# Patient Record
Sex: Female | Born: 1990 | Race: White | Hispanic: No | Marital: Single | Smoking: Never smoker
Health system: Southern US, Community
[De-identification: ages and names within clinical notes are randomized; demographics above are authoritative.]

---

## 2011-01-24 ENCOUNTER — Emergency Department (INDEPENDENT_AMBULATORY_CARE_PROVIDER_SITE_OTHER): Payer: BC Managed Care – PPO

## 2011-01-24 ENCOUNTER — Emergency Department (HOSPITAL_BASED_OUTPATIENT_CLINIC_OR_DEPARTMENT_OTHER)
Admission: EM | Admit: 2011-01-24 | Discharge: 2011-01-24 | Disposition: A | Discharge status: Home / Self Care | Payer: BC Managed Care – PPO | Attending: Emergency Medicine | Admitting: Emergency Medicine

## 2011-01-24 DIAGNOSIS — S93409A Sprain of unspecified ligament of unspecified ankle, initial encounter: Secondary | ICD-10-CM | POA: Insufficient documentation

## 2011-01-24 DIAGNOSIS — W19XXXA Unspecified fall, initial encounter: Secondary | ICD-10-CM

## 2011-01-24 DIAGNOSIS — M25579 Pain in unspecified ankle and joints of unspecified foot: Secondary | ICD-10-CM

## 2011-01-24 DIAGNOSIS — X500XXA Overexertion from strenuous movement or load, initial encounter: Secondary | ICD-10-CM | POA: Insufficient documentation

## 2016-03-09 ENCOUNTER — Encounter (HOSPITAL_BASED_OUTPATIENT_CLINIC_OR_DEPARTMENT_OTHER): Payer: Self-pay | Admitting: *Deleted

## 2016-03-09 ENCOUNTER — Emergency Department (HOSPITAL_BASED_OUTPATIENT_CLINIC_OR_DEPARTMENT_OTHER): Payer: 59

## 2016-03-09 ENCOUNTER — Emergency Department (HOSPITAL_BASED_OUTPATIENT_CLINIC_OR_DEPARTMENT_OTHER)
Admission: EM | Admit: 2016-03-09 | Discharge: 2016-03-09 | Disposition: A | Discharge status: Home / Self Care | Payer: 59 | Attending: Emergency Medicine | Admitting: Emergency Medicine

## 2016-03-09 DIAGNOSIS — X509XXA Other and unspecified overexertion or strenuous movements or postures, initial encounter: Secondary | ICD-10-CM | POA: Insufficient documentation

## 2016-03-09 DIAGNOSIS — Y929 Unspecified place or not applicable: Secondary | ICD-10-CM | POA: Diagnosis not present

## 2016-03-09 DIAGNOSIS — Y999 Unspecified external cause status: Secondary | ICD-10-CM | POA: Diagnosis not present

## 2016-03-09 DIAGNOSIS — Y9364 Activity, baseball: Secondary | ICD-10-CM | POA: Insufficient documentation

## 2016-03-09 DIAGNOSIS — S93401A Sprain of unspecified ligament of right ankle, initial encounter: Secondary | ICD-10-CM | POA: Diagnosis present

## 2016-03-09 NOTE — ED Triage Notes (Signed)
Pt c/o right ankle injury x 1 hr ago  

## 2016-03-09 NOTE — ED Provider Notes (Signed)
[facility]   CSN: [id number] Arrival date & time: 03/09/16  2123  First Provider Contact:   First MD Initiated Contact with Patient 03/09/16 2241     By signing my name below, I, Freida Busman, attest that this documentation has been prepared under the direction and in the presence of Zadie Rhine, MD . Electronically Signed: Freida Busman, Scribe. 03/09/2016. 10:50 PM.   History   Chief Complaint Chief Complaint  Patient presents with  . Ankle Injury    The history is provided by the patient. No language interpreter was used.  Ankle Injury  This is a new problem. The current episode started 3 to 5 hours ago. The problem occurs constantly. The problem has not changed since onset.Pertinent negatives include no chest pain, no abdominal pain, no headaches and no shortness of breath. The symptoms are aggravated by walking. The symptoms are relieved by NSAIDs.     HPI Comments:  Diana Hunt is a 25 y.o. female who presents to the Emergency Department complaining of constant, moderate, pain to the right ankle s/p injury this evening ~ 1930. Pt was playing softball when she jumped up and rolled the ankle outward as she landed on a base. Her pain is exacerbated when she bears weight on the extremity. She has taken  ibuprofen with mild relief.  She denies knee pain. Pt has no other complaints, symptoms or injuries at this time.   History reviewed. No pertinent past medical history.  There are no active problems to display for this patient.   History reviewed. No pertinent surgical history.  OB History    No data available       Home Medications    Prior to Admission medications   Not on File    Family History No family history on file.  Social History Social History  Substance Use Topics  . Smoking status: Never Smoker  . Smokeless tobacco: Not on file  . Alcohol use No     Allergies   Review of patient's allergies indicates no  known allergies.   Review of Systems Review of Systems  Respiratory: Negative for shortness of breath.   Cardiovascular: Negative for chest pain.  Gastrointestinal: Negative for abdominal pain.  Musculoskeletal: Positive for arthralgias and joint swelling.  Neurological: Negative for syncope and headaches.   Physical Exam Updated Vital Signs BP 125/81 (BP Location: Left Arm)   Pulse 88   Temp 98.2 F (36.8 C) (Oral)   Resp 18   Ht  (1.727 m)   Wt 170 lb (77.1 kg)   LMP 02/21/2016   SpO2 100%   BMI 25.85 kg/m   Physical Exam  CONSTITUTIONAL: Well developed/well nourished HEAD: Normocephalic/atraumatic ENMT: Mucous membranes moist NECK: supple no meningeal signs CV: S1/S2 noted, no murmurs/rubs/gallops noted LUNGS: Lungs are clear to auscultation bilaterally, no apparent distress ABDOMEN: soft, nontender NEURO: Pt is awake/alert/appropriate, moves all extremitiesx4.  No facial droop.   EXTREMITIES: pulses normal/equal, full ROM; tenderness and swelling to right lateral malleolus, no distal foot tenderness, no proximal fibula tenderness SKIN: warm, color normal PSYCH: no abnormalities of mood noted, alert and oriented to situation ED Treatments / Results  DIAGNOSTIC STUDIES:  Oxygen Saturation is 100% on RA, normal by my interpretation.    COORDINATION OF CARE:  10:48 PM Discussed treatment plan with pt at bedside and pt agreed to plan.  Labs (all labs ordered are listed, but only abnormal results are displayed) Labs Reviewed - No data to  display  EKG  EKG Interpretation None       Radiology Dg Ankle Complete Right  Result Date: 03/09/2016 CLINICAL DATA:  25 year old female with right ankle injury EXAM: RIGHT ANKLE - COMPLETE 3+ VIEW COMPARISON:  None. FINDINGS: There is no evidence of fracture, dislocation, or joint effusion. There is no evidence of arthropathy or other focal bone abnormality. Soft tissues are unremarkable. IMPRESSION: Negative.  Electronically Signed   By: Elgie Collard M.D.   On: 03/09/2016 22:05    Procedures Procedures   Medications Ordered in ED Medications - No data to display   Initial Impression / Assessment and Plan / ED Course  I have reviewed the triage vital signs and the nursing notes.  Pertinent labs & imaging results that were available during my care of the patient were reviewed by me and considered in my medical decision making (see chart for details).  Clinical Course    Crutches ASO F/u with sports med Pt agreeable  Final Clinical Impressions(s) / ED Diagnoses   Final diagnoses:  Sprain of right ankle, initial encounter    New Prescriptions New Prescriptions   No medications on file   I personally performed the services described in this documentation, which was scribed in my presence. The recorded information has been reviewed and is accurate.        Zadie Rhine, MD 03/09/16 2326

## 2016-03-17 ENCOUNTER — Encounter: Payer: Self-pay | Admitting: Family Medicine

## 2016-03-17 ENCOUNTER — Ambulatory Visit (INDEPENDENT_AMBULATORY_CARE_PROVIDER_SITE_OTHER): Payer: 59 | Admitting: Family Medicine

## 2016-03-17 DIAGNOSIS — S93401A Sprain of unspecified ligament of right ankle, initial encounter: Secondary | ICD-10-CM | POA: Diagnosis not present

## 2016-03-17 DIAGNOSIS — M25551 Pain in right hip: Secondary | ICD-10-CM

## 2016-03-17 NOTE — Patient Instructions (Signed)
You have an ankle sprain. Ice the area for 15 minutes at a time, 3-4 times a day Aleve 2 tabs twice a day with food OR ibuprofen 3 tabs three times a day with food for pain and inflammation as needed. Elevate above the level of your heart when possible Use laceup ankle brace to help with stability while you recover from this injury. Come out of the boot/brace twice a day to do Up/down and alphabet exercises 2-3 sets of each. Start theraband strengthening exercises once a day 3 sets of 10. Consider physical therapy for strengthening and balance exercises in the future. Follow up with me in 2 weeks.  I'm concerned about a labral tear in your right hip. There are no great answers for this unfortunately - most people improve with physical therapy but being 4 years out you're less optimistic conservative measures will help. Consider an MRI arthrogram of the hip if you want to move forward with next steps.

## 2016-03-21 DIAGNOSIS — S93401A Sprain of unspecified ligament of right ankle, initial encounter: Secondary | ICD-10-CM | POA: Insufficient documentation

## 2016-03-21 DIAGNOSIS — M25551 Pain in right hip: Secondary | ICD-10-CM | POA: Insufficient documentation

## 2016-03-21 NOTE — Assessment & Plan Note (Signed)
Independently reviewed radiographs - no evidence fracture.  Shown home exercises to do daily.  Icing, nsaids, elevation.  Continue use of ASO.  Consider PT in future.  F/u in 2 weeks.

## 2016-03-21 NOTE — Progress Notes (Signed)
PCP: Jillyn LedgerSamantha Ziglar FNP  Subjective:   HPI: Patient is a 25 y.o. female here for right ankle injury, right hip pain.  Patient reports on 8/2 she was playing softball - jumped up to catch a wild throw and came down, inverting right ankle. Immediate pain, couldn't bear weight, + swelling. No prior injuries. Taking ibuprofen, elevating, using ASO. Doing epsom salt soaks. Used crutches first few days. No skin changes, numbness. She also reports several year history of right hip pain within groin. Associated popping. Initially treated for muscle strain but never completely improved. Has problems now every time she tries to run. Never had MRI.  No past medical history on file.  No current outpatient prescriptions on file prior to visit.   No current facility-administered medications on file prior to visit.     No past surgical history on file.  No Known Allergies  Social History   Social History  . Marital status: Single    Spouse name: N/A  . Number of children: N/A  . Years of education: N/A   Occupational History  . Not on file.   Social History Main Topics  . Smoking status: Never Smoker  . Smokeless tobacco: Never Used  . Alcohol use No  . Drug use: No  . Sexual activity: Yes    Birth control/ protection: Pill   Other Topics Concern  . Not on file   Social History Narrative  . No narrative on file    No family history on file.  BP 110/79   Pulse 77   Ht 5\' 8"  (1.727 m)   Wt 175 lb (79.4 kg)   LMP 02/21/2016   BMI 26.61 kg/m   Review of Systems: See HPI above.    Objective:  Physical Exam:  Gen: NAD, comfortable in exam room  Right ankle: Mild lateral swelling.  Ecchymoses laterally and lateral heel area.  No other deformity. FROM. TTP over ATFL, lateral malleolus.  No other tenderness. 1+ ant drawer and negative talar tilt.   Negative syndesmotic compression. Thompsons test negative. NV intact distally.  Right hip: No gross  deformity, swelling, bruising. FROM hip. Loud clunk from flexion of hip to extension, painful anterior hip. Strength 5/5 with all motions. Negative logroll. NVI distally.    Assessment & Plan:  1. Right ankle sprain - Independently reviewed radiographs - no evidence fracture.  Shown home exercises to do daily.  Icing, nsaids, elevation.  Continue use of ASO.  Consider PT in future.  F/u in 2 weeks.  2. 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  Able to reproduce clunk on exam.  We discussed conservative treatment - she has tried in the past without long term success.  She will consider MRI arthrogram.

## 2016-03-21 NOTE — Assessment & Plan Note (Signed)
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  Able to reproduce clunk on exam.  We discussed conservative treatment - she has tried in the past without long term success.  She will consider MRI arthrogram.

## 2016-03-31 ENCOUNTER — Ambulatory Visit (HOSPITAL_BASED_OUTPATIENT_CLINIC_OR_DEPARTMENT_OTHER)
Admission: RE | Admit: 2016-03-31 | Discharge: 2016-03-31 | Disposition: A | Discharge status: Home / Self Care | Payer: 59 | Source: Ambulatory Visit | Attending: Family Medicine | Admitting: Family Medicine

## 2016-03-31 ENCOUNTER — Encounter: Payer: Self-pay | Admitting: Family Medicine

## 2016-03-31 ENCOUNTER — Ambulatory Visit (INDEPENDENT_AMBULATORY_CARE_PROVIDER_SITE_OTHER): Payer: 59 | Admitting: Family Medicine

## 2016-03-31 VITALS — BP 116/75 | HR 77 | Ht 68.0 in | Wt 170.0 lb

## 2016-03-31 DIAGNOSIS — M25551 Pain in right hip: Secondary | ICD-10-CM | POA: Diagnosis not present

## 2016-03-31 DIAGNOSIS — S93401D Sprain of unspecified ligament of right ankle, subsequent encounter: Secondary | ICD-10-CM | POA: Diagnosis not present

## 2016-03-31 NOTE — Patient Instructions (Signed)
You have an ankle sprain. Ice the area for 15 minutes at a time, 3-4 times a day Aleve 2 tabs twice a day with food OR ibuprofen 3 tabs three times a day with food for pain and inflammation as needed at this point. Elevate above the level of your heart when possible for swelling. Use laceup brace definitely with exercise for 4 more weeks.   Use the brace on irregular surfaces, with a lot of walking too. Theraband strengthening exercises once a day 3 sets of 10. Call me if you're struggling and want to do physical therapy. Follow up with me in 4 weeks or as needed.  We will go ahead with an MRI arthrogram of your hip.

## 2016-04-04 NOTE — Progress Notes (Addendum)
PCP: Jillyn LedgerSamantha Ziglar FNP  Subjective:   HPI: Patient is a 25 y.o. female here for right ankle injury, right hip pain.  8/10: Patient reports on 8/2 she was playing softball - jumped up to catch a wild throw and came down, inverting right ankle. Immediate pain, couldn't bear weight, + swelling. No prior injuries. Taking ibuprofen, elevating, using ASO. Doing epsom salt soaks. Used crutches first few days. No skin changes, numbness. She also reports several year history of right hip pain within groin. Associated popping. Initially treated for muscle strain but never completely improved. Has problems now every time she tries to run. Never had MRI.  8/24: Patient reports noting she is improving since last visit. She tried to run on Monday but had a lot of difficulty - only able to do 2.5 minutes. Wearing ASO. Doing home exercises. Not requiring medication or icing. Pain level 0/10 at rest currently. No skin changes, numbness. She would like to go ahead with MR arthrogram of her hip.  No past medical history on file.  Current Outpatient Prescriptions on File Prior to Visit  Medication Sig Dispense Refill  . drospirenone-ethinyl estradiol (YAZ,GIANVI,LORYNA) 3-0.02 MG tablet TAKE 1 TABLET BY MOUTH DAILY.     No current facility-administered medications on file prior to visit.     No past surgical history on file.  No Known Allergies  Social History   Social History  . Marital status: Single    Spouse name: N/A  . Number of children: N/A  . Years of education: N/A   Occupational History  . Not on file.   Social History Main Topics  . Smoking status: Never Smoker  . Smokeless tobacco: Never Used  . Alcohol use No  . Drug use: No  . Sexual activity: Yes    Birth control/ protection: Pill   Other Topics Concern  . Not on file   Social History Narrative  . No narrative on file    No family history on file.  BP 116/75   Pulse 77   Ht 5\' 8"  (1.727 m)   Wt  170 lb (77.1 kg)   LMP 03/22/2016   BMI 25.85 kg/m   Review of Systems: See HPI above.    Objective:  Physical Exam:  Gen: NAD, comfortable in exam room  Right ankle: Mild lateral swelling.  No ecchymoses, other deformity. FROM. TTP over ATFL only.  No other tenderness. Trace ant drawer and negative talar tilt.   Negative syndesmotic compression. Thompsons test negative. NV intact distally.    Assessment & Plan:  1. Right ankle sprain - radiographs negative.  Clinically improving but not able to run yet.  Continue with ASO 4 more weeks with exercise, home exercises, aleve or ibuprofen.  Icing.  F/u in 4 weeks.  Consider PT if not improving.  2. 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  Will go ahead with MR arthrogram.  Addendum:  MR arthrogram of right hip reviewed and discussed with patient - surprisingly she does not have a labral tear or any other pathology on MRI.  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  She will consider revisiting this specifically for snapping hip - she is going to come by the office to get a home exercise program and stretches.

## 2016-04-04 NOTE — Assessment & Plan Note (Signed)
radiographs negative.  Clinically improving but not able to run yet.  Continue with ASO 4 more weeks with exercise, home exercises, aleve or ibuprofen.  Icing.  F/u in 4 weeks.  Consider PT if not improving.

## 2016-04-04 NOTE — Assessment & Plan Note (Signed)
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  Will go ahead with MR arthrogram.

## 2016-04-19 ENCOUNTER — Ambulatory Visit
Admission: RE | Admit: 2016-04-19 | Discharge: 2016-04-19 | Disposition: A | Discharge status: Home / Self Care | Payer: 59 | Source: Ambulatory Visit | Attending: Family Medicine | Admitting: Family Medicine

## 2016-04-19 DIAGNOSIS — M25551 Pain in right hip: Secondary | ICD-10-CM

## 2016-04-19 MED ORDER — IOPAMIDOL (ISOVUE-M 200) INJECTION 41%
20.0000 mL | Freq: Once | INTRAMUSCULAR | Status: AC
  Administered 2016-04-19: 20 mL via INTRA_ARTICULAR

## 2016-04-21 ENCOUNTER — Telehealth: Payer: Self-pay | Admitting: Family Medicine

## 2016-04-22 NOTE — Telephone Encounter (Signed)
Spoke with her yesterday - see addendum to office visit note.

## 2016-04-22 NOTE — Addendum Note (Signed)
Addended by: Kathi SimpersWISE, Alvera Tourigny F on: 04/22/2016 12:31 PM   Modules accepted: Orders

## 2017-05-03 IMAGING — XA DG FLUORO GUIDE NDL PLC/BX
1 series · 1 of 1 positions shown · non-contrast
Comparison: none

CLINICAL DATA: Right hip pain.  Pre MRI injection.

[Series 1: ortho standard · 1 of 1 slices shown]
[im 1/1]
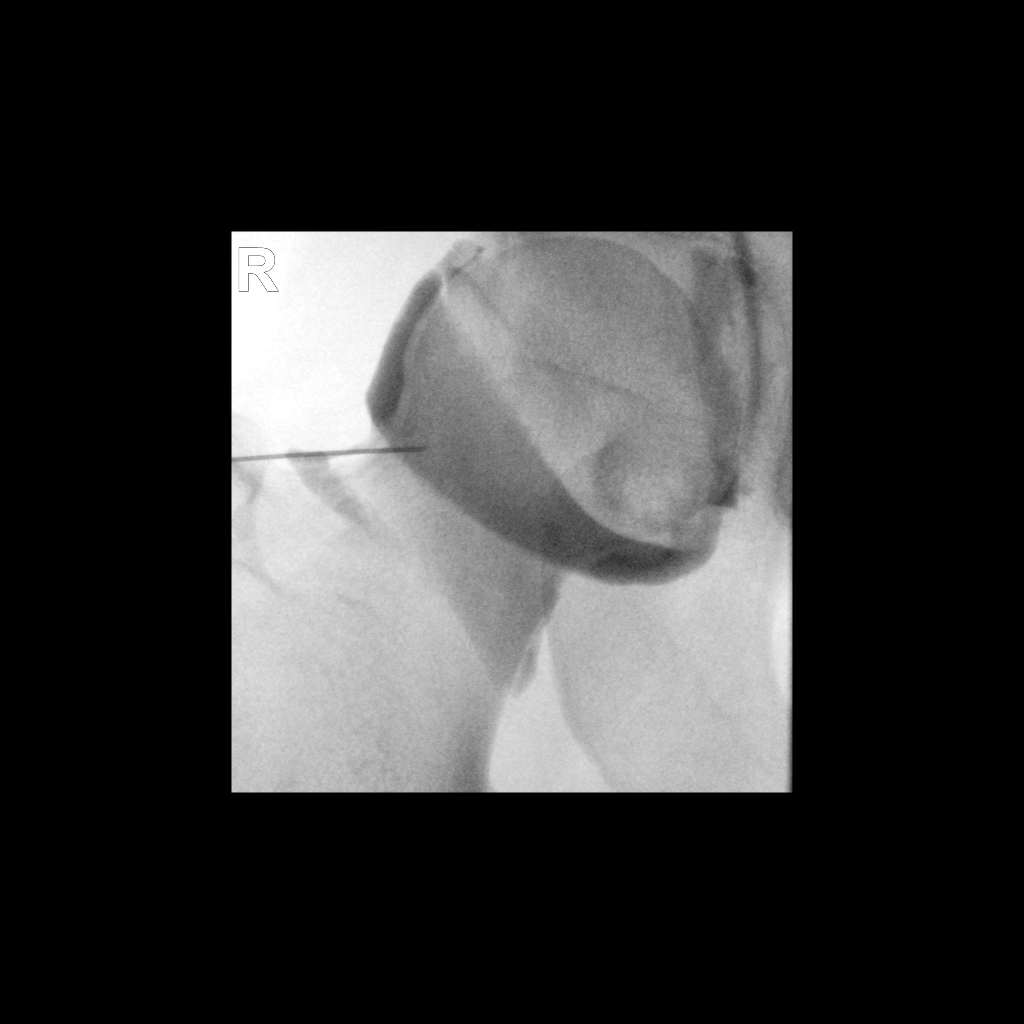

[1 of 1 positions shown; findings below may reference images not displayed]

FLUOROSCOPY TIME:  0 minutes 13 seconds. 23.44 micro gray meter
squared

PROCEDURE:
Right hip INJECTION UNDER FLUOROSCOPY

Skin antro lateral to the right hip was scrubbed with Betadine and
draped in sterile fashion. Local anesthesia was carried [DATE]%
lidocaine. 22 gauge needle was placed on 1 pass into the hip joint.
The joint was filled with 18 cc of dilute Omnipaque 200 mixed with
0.1 cc MultiHance. Single image was saved. The procedure was
well-tolerated in the patient was taken to MRI in a chair.
IMPRESSION: Technically successful right hip injection for MRI.
